# Patient Record
Sex: Male | Born: 2000 | Race: Black or African American | Hispanic: No | Marital: Single | Smoking: Never smoker
Health system: Southern US, Community
[De-identification: ages and names within clinical notes are randomized; demographics above are authoritative.]

## PROBLEM LIST (undated history)

## (undated) DIAGNOSIS — J45909 Unspecified asthma, uncomplicated: Secondary | ICD-10-CM

## (undated) DIAGNOSIS — R5600 Simple febrile convulsions: Secondary | ICD-10-CM

## (undated) HISTORY — PX: EYE SURGERY: SHX253

---
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
## 2019-08-11 IMAGING — DX DG FOOT COMPLETE 3+V*R*
3 series · 3 of 3 positions shown · non-contrast
Comparison: None.

CLINICAL DATA: Pt to ED with c/o of right foot pain that started
Wed. Pt states top of foot hurts over distal metatarsals and feels
it more "when he runs" No swelling noted.

EXAM:
RIGHT FOOT COMPLETE - 3+ VIEW

[foot ap]
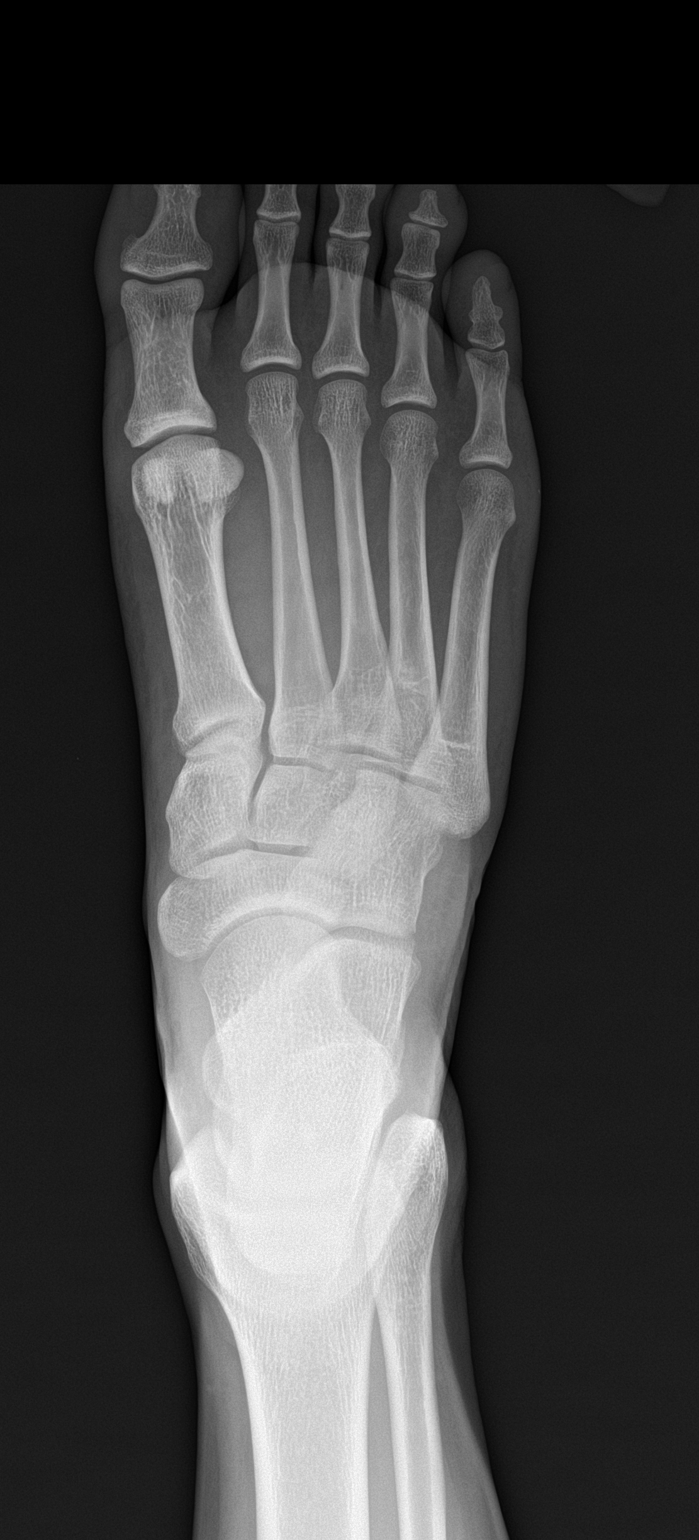

[foot obl]
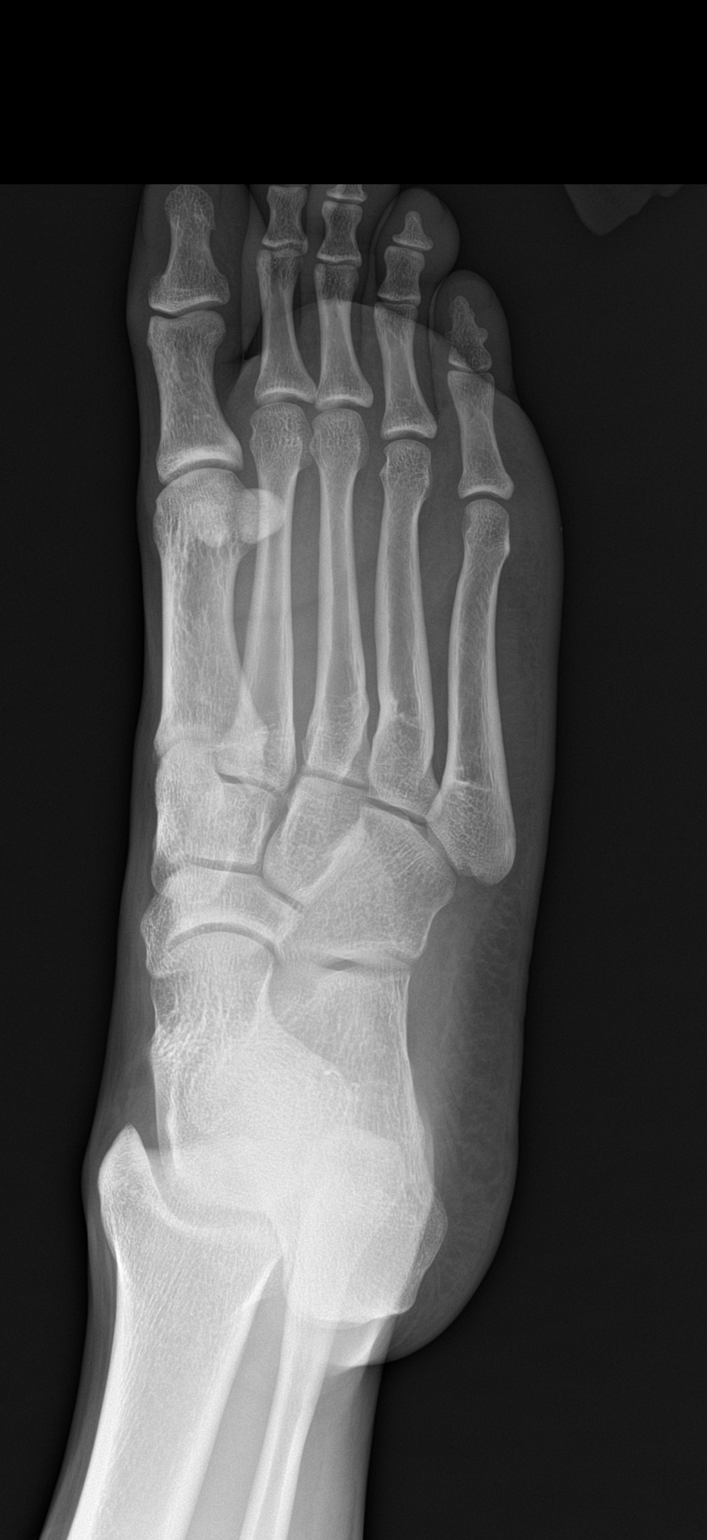

[foot lat]
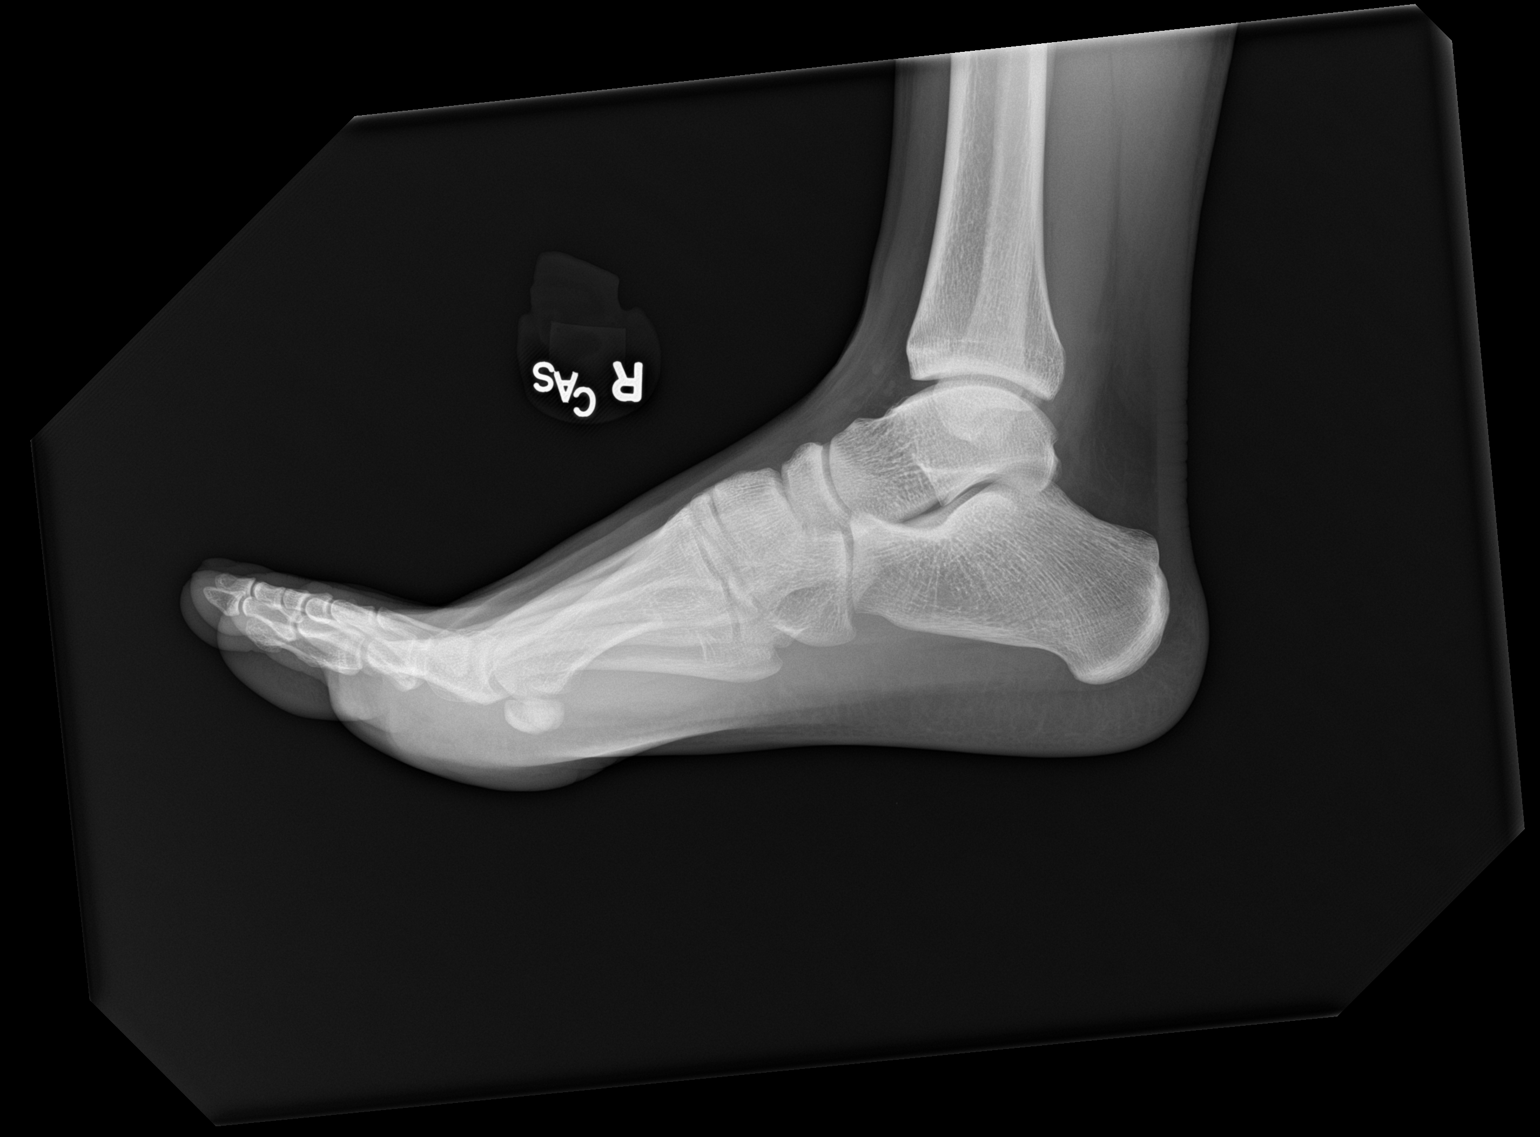

[3 of 3 positions shown; findings below may reference images not displayed]

FINDINGS: There is no evidence of fracture or dislocation. There is no
evidence of arthropathy or other focal bone abnormality. Soft
tissues are unremarkable.
IMPRESSION: Negative.
